# Patient Record
Sex: Male | Born: 1970 | Race: White | Hispanic: No | Marital: Married | Smoking: Former smoker
Health system: Southern US, Community
[De-identification: ages and names within clinical notes are randomized; demographics above are authoritative.]

## PROBLEM LIST (undated history)

## (undated) DIAGNOSIS — F39 Unspecified mood [affective] disorder: Secondary | ICD-10-CM

## (undated) DIAGNOSIS — F419 Anxiety disorder, unspecified: Secondary | ICD-10-CM

## (undated) DIAGNOSIS — Z860100 Personal history of colon polyps, unspecified: Secondary | ICD-10-CM

## (undated) DIAGNOSIS — F319 Bipolar disorder, unspecified: Secondary | ICD-10-CM

## (undated) HISTORY — PX: ESOPHAGOGASTRODUODENOSCOPY: SHX1529

## (undated) HISTORY — PX: COLONOSCOPY: SHX174

---

## 2004-06-01 ENCOUNTER — Ambulatory Visit: Payer: Self-pay | Admitting: Gastroenterology

## 2005-10-17 ENCOUNTER — Emergency Department: Payer: Self-pay | Admitting: Emergency Medicine

## 2009-11-11 ENCOUNTER — Emergency Department: Payer: Self-pay | Admitting: Internal Medicine

## 2015-01-17 ENCOUNTER — Emergency Department
Admission: EM | Admit: 2015-01-17 | Discharge: 2015-01-17 | Disposition: A | Discharge status: Home / Self Care | Payer: Worker's Compensation | Attending: Emergency Medicine | Admitting: Emergency Medicine

## 2015-01-17 ENCOUNTER — Emergency Department: Payer: Worker's Compensation

## 2015-01-17 ENCOUNTER — Encounter: Payer: Self-pay | Admitting: Medical Oncology

## 2015-01-17 DIAGNOSIS — S3992XA Unspecified injury of lower back, initial encounter: Secondary | ICD-10-CM | POA: Diagnosis present

## 2015-01-17 DIAGNOSIS — Y9289 Other specified places as the place of occurrence of the external cause: Secondary | ICD-10-CM | POA: Insufficient documentation

## 2015-01-17 DIAGNOSIS — X58XXXA Exposure to other specified factors, initial encounter: Secondary | ICD-10-CM | POA: Insufficient documentation

## 2015-01-17 DIAGNOSIS — Y9389 Activity, other specified: Secondary | ICD-10-CM | POA: Diagnosis not present

## 2015-01-17 DIAGNOSIS — S39012A Strain of muscle, fascia and tendon of lower back, initial encounter: Secondary | ICD-10-CM | POA: Diagnosis not present

## 2015-01-17 DIAGNOSIS — M545 Low back pain, unspecified: Secondary | ICD-10-CM

## 2015-01-17 DIAGNOSIS — Y990 Civilian activity done for income or pay: Secondary | ICD-10-CM | POA: Insufficient documentation

## 2015-01-17 DIAGNOSIS — F17200 Nicotine dependence, unspecified, uncomplicated: Secondary | ICD-10-CM | POA: Insufficient documentation

## 2015-01-17 HISTORY — DX: Anxiety disorder, unspecified: F41.9

## 2015-01-17 HISTORY — DX: Bipolar disorder, unspecified: F31.9

## 2015-01-17 MED ORDER — KETOROLAC TROMETHAMINE 60 MG/2ML IM SOLN
60.0000 mg | Freq: Once | INTRAMUSCULAR | Status: AC
  Administered 2015-01-17: 60 mg via INTRAMUSCULAR
  Filled 2015-01-17: qty 2

## 2015-01-17 MED ORDER — NAPROXEN 500 MG PO TABS: 500.0000 mg | ORAL_TABLET | Freq: Two times a day (BID) | ORAL | Status: DC

## 2015-01-17 MED ORDER — DIAZEPAM 2 MG PO TABS
2.0000 mg | ORAL_TABLET | Freq: Once | ORAL | Status: AC
  Administered 2015-01-17: 2 mg via ORAL
  Filled 2015-01-17: qty 1

## 2015-01-17 MED ORDER — HYDROCODONE-ACETAMINOPHEN 5-325 MG PO TABS: 1.0000 | ORAL_TABLET | ORAL | Status: DC | PRN

## 2015-01-17 MED ORDER — DIAZEPAM 2 MG PO TABS
2.0000 mg | ORAL_TABLET | Freq: Three times a day (TID) | ORAL | Status: DC | PRN
Start: 2015-01-17 — End: 2023-09-17

## 2015-01-17 NOTE — ED Provider Notes (Signed)
[facility] Emergency Department Provider Note  ____________________________________________  Time seen: Approximately 11:02 AM  I have reviewed the triage vital signs and the nursing notes.   HISTORY  Chief Complaint Back Pain  HPI Brian Kelly is a 44 y.o. male is brought in today by his boss with low back pain. Patient states that he bent over and felt a "twinge" to his lower back. This is left range of motion difficult for him as well as painful. Patient states that he had some back pain last year that was similar and was resolved with medication. He denies any paresthesias in his legs, no bowel or bladder incontinence. He finds it difficult to ambulate secondary to his pain. Currently his pain is 10 over 10.Currently this being filed for workmen's comp.   Past Medical History  Diagnosis Date  . Bipolar 1 disorder ([place])   . Anxiety     There are no active problems to display for this patient.   History reviewed. No pertinent past surgical history.  Current Outpatient Rx  Name  Route  Sig  Dispense  Refill  . diazepam (VALIUM) 2 MG tablet   Oral   Take 1 tablet (2 mg total) by mouth every 8 (eight) hours as needed for muscle spasms.   9 tablet   0   . HYDROcodone-acetaminophen (NORCO/VICODIN) 5-325 MG tablet   Oral   Take 1 tablet by mouth every 4 (four) hours as needed for moderate pain.   20 tablet   0   . naproxen (NAPROSYN) 500 MG tablet   Oral   Take 1 tablet (500 mg total) by mouth 2 (two) times daily with a meal.   30 tablet   0     Allergies Dilaudid  No family history on file.  Social History Social History  Substance Use Topics  . Smoking status: Current Every Day Smoker  . Smokeless tobacco: None  . Alcohol Use: No    Review of Systems Constitutional: No fever/chills Cardiovascular: Denies chest pain. Respiratory: Denies shortness of breath. Gastrointestinal: No abdominal pain.  No nausea, no vomiting.    Genitourinary: Negative for dysuria. Musculoskeletal: Positive for back pain. Skin: Negative for rash. Neurological: Negative for headaches, focal weakness or numbness.  10-point ROS otherwise negative.  ____________________________________________   PHYSICAL EXAM:  VITAL SIGNS: ED Triage Vitals  Enc Vitals Group     BP 01/17/15 1025 145/93 mmHg     Pulse Rate 01/17/15 1025 73     Resp 01/17/15 1025 18     Temp 01/17/15 1025 98.2 F (36.8 C)     Temp Source 01/17/15 1025 Oral     SpO2 01/17/15 1025 96 %     Weight 01/17/15 1025 180 lb (81.647 kg)     Height 01/17/15 1025 5\' 9"  (1.753 m)     Head Cir --      Peak Flow --      Pain Score 01/17/15 1026 10     Pain Loc --      Pain Edu? --      Excl. in [place]? --     Constitutional: Alert and oriented. Well appearing and in no acute distress. Eyes: Conjunctivae are normal. PERRL. EOMI. Head: Atraumatic. Nose: No congestion/rhinnorhea. Neck: No stridor.   Cardiovascular: Normal rate, regular rhythm. Grossly normal heart sounds.  Good peripheral circulation. Respiratory: Normal respiratory effort.  No retractions. Lungs CTAB. Gastrointestinal: Soft and nontender. No distention. Musculoskeletal: Moderate tenderness on palpation of the sacral  area. Minimal tenderness on palpation of paravertebral muscles in that area. Range of motion is restricted secondary to pain. Straight leg raises are 90 with pain on the right side. Gait was guarded. Neurologic:  Normal speech and language. No gross focal neurologic deficits are appreciated.  Reflexes are 2+ bilaterally. Skin:  Skin is warm, dry and intact. No rash noted. Psychiatric: Mood and affect are normal. Speech and behavior are normal.  ____________________________________________   LABS (all labs ordered are listed, but only abnormal results are displayed)  Labs Reviewed - No data to display   RADIOLOGY  Lumbar spine x-ray per radiologist shows no acute findings. There is  mild spondylotic thick spurring without significant dyspnea or wheezing. ____________________________________________   PROCEDURES  Procedure(s) performed: None  Critical Care performed: No  ____________________________________________   INITIAL IMPRESSION / ASSESSMENT AND PLAN / ED COURSE  Pertinent labs & imaging results that were available during my care of the patient were reviewed by me and considered in my medical decision making (see chart for details).  Patient was given Toradol 60 mg IM while in the emergency room along with Valium 2 mg by mouth. Patient was made aware of his x-ray findings and discharged on a prescription with naproxen 500 mg twice a day with food, Norco as needed for pain every 4 hours, and Valium 2 mg 1 every 8 hours when necessary muscle spasms for 3 days. Patient was given a note to take back to work with him for 2 days that he is not to work on these medications as it could cause drowsiness and also no lifting over 25 pounds. He is to follow-up with Dr. Roland Rack if any continued problems. ____________________________________________   FINAL CLINICAL IMPRESSION(S) / ED DIAGNOSES  Final diagnoses:  Acute low back pain  Low back strain, initial encounter      DALESSANDRO SCATENA, PA-C 01/17/15 1432  Earleen Newport, MD 01/17/15 [phone]

## 2015-01-17 NOTE — ED Notes (Signed)
Pt employer called to provide transportation back to work.

## 2015-01-17 NOTE — ED Notes (Addendum)
Pt reports hurting his back while working; states he bent over and when he stood up, back began to hurt.  Pt w/ difficulty standing due to pain.

## 2015-01-17 NOTE — ED Notes (Signed)
Pt was at work this am when he bent over and injured lower back. WC.

## 2015-01-17 NOTE — Discharge Instructions (Signed)
Back Pain, Adult Back pain is very common. The pain often gets better over time. The cause of back pain is usually not dangerous. Most people can learn to manage their back pain on their own.  HOME CARE  Watch your back pain for any changes. The following actions may help to lessen any pain you are feeling:  Stay active. Start with short walks on flat ground if you can. Try to walk farther each day.  Exercise regularly as told by your doctor. Exercise helps your back heal faster. It also helps avoid future injury by keeping your muscles strong and flexible.  Do not sit, drive, or stand in one place for more than 30 minutes.  Do not stay in bed. Resting more than 1-2 days can slow down your recovery.  Be careful when you bend or lift an object. Use good form when lifting:  Bend at your knees.  Keep the object close to your body.  Do not twist.  Sleep on a firm mattress. Lie on your side, and bend your knees. If you lie on your back, put a pillow under your knees.  Take medicines only as told by your doctor.  Put ice on the injured area.  Put ice in a plastic bag.  Place a towel between your skin and the bag.  Leave the ice on for 20 minutes, 2-3 times a day for the first 2-3 days. After that, you can switch between ice and heat packs.  Avoid feeling anxious or stressed. Find good ways to deal with stress, such as exercise.  Maintain a healthy weight. Extra weight puts stress on your back. GET HELP IF:   You have pain that does not go away with rest or medicine.  You have worsening pain that goes down into your legs or buttocks.  You have pain that does not get better in one week.  You have pain at night.  You lose weight.  You have a fever or chills. GET HELP RIGHT AWAY IF:   You cannot control when you poop (bowel movement) or pee (urinate).  Your arms or legs feel weak.  Your arms or legs lose feeling (numbness).  You feel sick to your stomach (nauseous) or  throw up (vomit).  You have belly (abdominal) pain.  You feel like you may pass out (faint).   This information is not intended to replace advice given to you by your health care provider. Make sure you discuss any questions you have with your health care provider.   Document Released: 07/17/2007 Document Revised: 02/18/2014 Document Reviewed: 06/01/2013 Elsevier Interactive Patient Education 2016 [place] or heat to low back as needed for comfort. Begin taking naproxen 500 mg twice a day with food, Norco as needed for severe pain, and diazepam one every 8 hours as needed for muscle spasms. You may not drive while taking this medication as it may increase your drowsiness. Follow-up with Dr. Roland Rack if any continued problems with your back. Return to the emergency room if any severe worsening of her symptoms or loss of bowel or bladder control.

## 2016-07-19 ENCOUNTER — Encounter: Payer: Self-pay | Admitting: *Deleted

## 2016-07-22 ENCOUNTER — Ambulatory Visit
Admission: RE | Admit: 2016-07-22 | Discharge: 2016-07-22 | Disposition: A | Discharge status: Home / Self Care | Payer: 59 | Source: Ambulatory Visit | Attending: Gastroenterology | Admitting: Gastroenterology

## 2016-07-22 ENCOUNTER — Encounter
Admission: RE | Disposition: A | Discharge status: Home / Self Care | Payer: Self-pay | Source: Ambulatory Visit | Attending: Gastroenterology

## 2016-07-22 ENCOUNTER — Ambulatory Visit: Payer: 59 | Admitting: Anesthesiology

## 2016-07-22 ENCOUNTER — Encounter: Payer: Self-pay | Admitting: *Deleted

## 2016-07-22 DIAGNOSIS — F419 Anxiety disorder, unspecified: Secondary | ICD-10-CM | POA: Diagnosis not present

## 2016-07-22 DIAGNOSIS — F319 Bipolar disorder, unspecified: Secondary | ICD-10-CM | POA: Insufficient documentation

## 2016-07-22 DIAGNOSIS — I10 Essential (primary) hypertension: Secondary | ICD-10-CM | POA: Diagnosis not present

## 2016-07-22 DIAGNOSIS — K529 Noninfective gastroenteritis and colitis, unspecified: Secondary | ICD-10-CM | POA: Diagnosis present

## 2016-07-22 DIAGNOSIS — D124 Benign neoplasm of descending colon: Secondary | ICD-10-CM | POA: Diagnosis not present

## 2016-07-22 DIAGNOSIS — Z885 Allergy status to narcotic agent status: Secondary | ICD-10-CM | POA: Insufficient documentation

## 2016-07-22 DIAGNOSIS — Z86010 Personal history of colonic polyps: Secondary | ICD-10-CM | POA: Insufficient documentation

## 2016-07-22 DIAGNOSIS — D649 Anemia, unspecified: Secondary | ICD-10-CM | POA: Insufficient documentation

## 2016-07-22 DIAGNOSIS — Z87891 Personal history of nicotine dependence: Secondary | ICD-10-CM | POA: Insufficient documentation

## 2016-07-22 DIAGNOSIS — D125 Benign neoplasm of sigmoid colon: Secondary | ICD-10-CM | POA: Insufficient documentation

## 2016-07-22 DIAGNOSIS — D128 Benign neoplasm of rectum: Secondary | ICD-10-CM | POA: Insufficient documentation

## 2016-07-22 DIAGNOSIS — Z79899 Other long term (current) drug therapy: Secondary | ICD-10-CM | POA: Insufficient documentation

## 2016-07-22 HISTORY — DX: Personal history of colonic polyps: Z86.010

## 2016-07-22 HISTORY — DX: Unspecified mood (affective) disorder: F39

## 2016-07-22 HISTORY — DX: Personal history of colon polyps, unspecified: Z86.0100

## 2016-07-22 HISTORY — PX: COLONOSCOPY WITH PROPOFOL: SHX5780

## 2016-07-22 SURGERY — COLONOSCOPY WITH PROPOFOL
Anesthesia: General

## 2016-07-22 MED ORDER — PROPOFOL 10 MG/ML IV BOLUS
INTRAVENOUS | Status: DC | PRN
  Administered 2016-07-22: 30 mg via INTRAVENOUS
  Administered 2016-07-22: 10 mg via INTRAVENOUS
  Administered 2016-07-22: 30 mg via INTRAVENOUS
  Administered 2016-07-22: 60 mg via INTRAVENOUS

## 2016-07-22 MED ORDER — SODIUM CHLORIDE 0.9 % IV SOLN: INTRAVENOUS | Status: DC

## 2016-07-22 MED ORDER — MIDAZOLAM HCL 2 MG/2ML IJ SOLN
INTRAMUSCULAR | Status: AC
  Filled 2016-07-22: qty 2

## 2016-07-22 MED ORDER — PROPOFOL 500 MG/50ML IV EMUL
INTRAVENOUS | Status: DC | PRN
  Administered 2016-07-22: 180 ug/kg/min via INTRAVENOUS

## 2016-07-22 MED ORDER — PROPOFOL 10 MG/ML IV BOLUS
INTRAVENOUS | Status: AC
  Filled 2016-07-22: qty 20

## 2016-07-22 MED ORDER — FENTANYL CITRATE (PF) 100 MCG/2ML IJ SOLN
INTRAMUSCULAR | Status: AC
  Filled 2016-07-22: qty 2

## 2016-07-22 MED ORDER — MIDAZOLAM HCL 2 MG/2ML IJ SOLN
INTRAMUSCULAR | Status: DC | PRN
  Administered 2016-07-22: 2 mg via INTRAVENOUS

## 2016-07-22 MED ORDER — PROPOFOL 500 MG/50ML IV EMUL
INTRAVENOUS | Status: AC
  Filled 2016-07-22: qty 50

## 2016-07-22 MED ORDER — LIDOCAINE HCL 2 % IJ SOLN
INTRAMUSCULAR | Status: AC
  Filled 2016-07-22: qty 10

## 2016-07-22 MED ORDER — FENTANYL CITRATE (PF) 100 MCG/2ML IJ SOLN
INTRAMUSCULAR | Status: DC | PRN
  Administered 2016-07-22: 50 ug via INTRAVENOUS
  Administered 2016-07-22: 25 ug via INTRAVENOUS

## 2016-07-22 MED ORDER — SODIUM CHLORIDE 0.9 % IV SOLN
INTRAVENOUS | Status: DC
  Administered 2016-07-22 (×2): via INTRAVENOUS

## 2016-07-22 NOTE — Anesthesia Procedure Notes (Signed)
Date/Time: 07/22/2016 2:05 PM Performed by: Allean Found Pre-anesthesia Checklist: Patient identified, Emergency Drugs available, Suction available, Patient being monitored and Timeout performed Oxygen Delivery Method: Nasal cannula Placement Confirmation: positive ETCO2

## 2016-07-22 NOTE — Anesthesia Post-op Follow-up Note (Cosign Needed)
Anesthesia QCDR form completed.        

## 2016-07-22 NOTE — Anesthesia Preprocedure Evaluation (Addendum)
Anesthesia Evaluation  Patient identified by MRN, date of birth, ID band Patient awake    Reviewed: Allergy & Precautions, NPO status , Patient's Chart, lab work & pertinent test results  History of Anesthesia Complications Negative for: history of anesthetic complications  Airway Mallampati: II  TM Distance: >3 FB Neck ROM: Full    Dental  (+) Poor Dentition   Pulmonary neg sleep apnea, neg COPD, former smoker,    breath sounds clear to auscultation- rhonchi (-) wheezing      Cardiovascular Exercise Tolerance: Good hypertension, (-) CAD and (-) Past MI  Rhythm:Regular Rate:Normal - Systolic murmurs and - Diastolic murmurs    Neuro/Psych PSYCHIATRIC DISORDERS Anxiety Bipolar Disorder negative neurological ROS     GI/Hepatic negative GI ROS, Neg liver ROS,   Endo/Other  negative endocrine ROSneg diabetes  Renal/GU negative Renal ROS     Musculoskeletal negative musculoskeletal ROS (+)   Abdominal (+) - obese,   Peds  Hematology negative hematology ROS (+)   Anesthesia Other Findings Past Medical History: No date: Anxiety No date: Bipolar 1 disorder ([facility]) No date: History of colon polyps No date: Mood disorder ([place])   Reproductive/Obstetrics                             Anesthesia Physical Anesthesia Plan  ASA: II  Anesthesia Plan: General   Post-op Pain Management:    Induction: Intravenous  PONV Risk Score and Plan: 1 and Propofol  Airway Management Planned: Natural Airway  Additional Equipment:   Intra-op Plan:   Post-operative Plan:   Informed Consent: I have reviewed the patients History and Physical, chart, labs and discussed the procedure including the risks, benefits and alternatives for the proposed anesthesia with the patient or authorized representative who has indicated his/her understanding and acceptance.   Dental advisory given  Plan Discussed with: CRNA  and Anesthesiologist  Anesthesia Plan Comments:        Anesthesia Quick Evaluation

## 2016-07-22 NOTE — Op Note (Signed)
[facility] Patient Name: Brian Kelly Procedure Date: 07/22/2016 1:59 PM MRN: [id number] Account #: [id number] Date of Birth: [birth year] Admit Type: Outpatient Age: 46 Room: [facility] ENDO ROOM 1 Gender: Male Note Status: Finalized Procedure:            Colonoscopy Indications:          Chronic diarrhea, Personal history of colonic polyps Providers:            Lollie Sails, MD Referring MD:         Tracie Harrier, MD (Referring MD) Medicines:            Monitored Anesthesia Care Complications:        No immediate complications. Procedure:            Pre-Anesthesia Assessment:                       - ASA Grade Assessment: II - A patient with mild                        systemic disease.                       After obtaining informed consent, the colonoscope was                        passed under direct vision. Throughout the procedure,                        the patient's blood pressure, pulse, and oxygen                        saturations were monitored continuously. The                        Colonoscope was introduced through the anus and                        advanced to the the cecum, identified by appendiceal                        orifice and ileocecal valve. The colonoscopy was                        unusually difficult due to poor bowel prep and a                        tortuous colon. Successful completion of the procedure                        was aided by lavage. The quality of the bowel                        preparation was fair. Findings:      Two sessile polyps were found in the rectum. The polyps were 1 to 2 mm       in size. These polyps were removed with a cold biopsy forceps. Resection       and retrieval were complete.      A 5 mm polyp was found in the distal sigmoid colon. The polyp was       pedunculated. The  polyp was removed with a cold snare. Resection and       retrieval were complete.      A 4 mm polyp was  found in the proximal sigmoid colon. The polyp was       flat. The polyp was removed with a cold snare. Resection and retrieval       were complete.      Two sessile polyps were found in the proximal descending colon. The       polyps were 3 to 5 mm in size. These polyps were removed with a cold       snare. Resection and retrieval were complete.      Biopsies for histology were taken with a cold forceps from the right       colon and left colon for evaluation of microscopic colitis.      Four sessile polyps were found in the distal descending colon. The       polyps were 3 to 6 mm in size. These polyps were removed with a cold       snare. Resection and retrieval were complete.      Two sessile polyps were found in the distal sigmoid colon. The polyps       were 1 to 2 mm in size. These polyps were removed with a cold biopsy       forceps. Resection and retrieval were complete. Impression:           - Preparation of the colon was fair.                       - Two 1 to 2 mm polyps in the rectum, removed with a                        cold biopsy forceps. Resected and retrieved.                       - One 5 mm polyp in the distal sigmoid colon, removed                        with a cold snare. Resected and retrieved.                       - One 4 mm polyp in the proximal sigmoid colon, removed                        with a cold snare. Resected and retrieved.                       - Two 3 to 5 mm polyps in the proximal descending                        colon, removed with a cold snare. Resected and                        retrieved.                       - Four 3 to 6 mm polyps in the distal descending colon,                        removed with a  cold snare. Resected and retrieved.                       - Two 1 to 2 mm polyps in the distal sigmoid colon,                        removed with a cold biopsy forceps. Resected and                        retrieved.                       - Biopsies  were taken with a cold forceps from the                        right colon and left colon for evaluation of                        microscopic colitis. Recommendation:       - Await pathology results.                       - Full liquid diet for 1 day, then advance as tolerated                        to low residue diet for 2 days.                       - Return to GI clinic in 3 weeks. Procedure Code(s):    --- Professional ---                       [phone], Colonoscopy, flexible; with removal of tumor(s),                        polyp(s), or other lesion(s) by snare technique                       45380, 34, Colonoscopy, flexible; with biopsy, single                        or multiple Diagnosis Code(s):    --- Professional ---                       K62.1, Rectal polyp                       D12.4, Benign neoplasm of descending colon                       D12.5, Benign neoplasm of sigmoid colon                       K52.9, Noninfective gastroenteritis and colitis,                        unspecified                       Z86.010, Personal history of colonic polyps CPT copyright 2016 American Medical [employer]. All rights reserved. The codes documented in this report are preliminary and upon coder review may  be  revised to meet current compliance requirements. Lollie Sails, MD 07/22/2016 3:22:00 PM This report has been signed electronically. Number of Addenda: 0 Note Initiated On: 07/22/2016 1:59 PM Scope Withdrawal Time: 0 hours 29 minutes 45 seconds  Total Procedure Duration: 0 hours 50 minutes 51 seconds       [facility]

## 2016-07-22 NOTE — Transfer of Care (Signed)
Immediate Anesthesia Transfer of Care Note  Patient: Brian Kelly  Procedure(s) Performed: Procedure(s): COLONOSCOPY WITH PROPOFOL (N/A)  Patient Location: PACU  Anesthesia Type:General  Level of Consciousness: awake  Airway & Oxygen Therapy: Patient Spontanous Breathing and Patient connected to nasal cannula oxygen  Post-op Assessment: Report given to RN and Post -op Vital signs reviewed and stable  Post vital signs: Reviewed and stable  Last Vitals:  Vitals:   07/22/16 1308 07/22/16 1521  BP: (!) 131/92 (!) 125/91  Pulse: 79 81  Resp:  16  Temp: (!) 35.8 C 36.1 C    Last Pain:  Vitals:   07/22/16 1521  TempSrc: Tympanic         Complications: No apparent anesthesia complications

## 2016-07-22 NOTE — H&P (Signed)
Outpatient short stay form Pre-procedure 07/22/2016 2:10 PM Lollie Sails MD  Primary Physician: Dr Tracie Harrier  Reason for visit:  Colonoscopy  History of present illness:  Patient is a 46 year old male with a history of chronic diarrhea for at least past 10 years. This is also at times associated with rectal bleeding. He does have some mild anemia that is normocytic. He does drink [address] use daily. I reviewed her EGD and colonoscopy that he had an 2006 which were uninformative as to this issue. He did also have a colonoscopy in 2005 with removal of a tubular villous adenoma that was 20 mm in size. There is also a second adenoma that was much smaller.    Current Facility-Administered Medications:  .  0.9 %  sodium chloride infusion, , Intravenous, Continuous, Lollie Sails, MD, Last Rate: 20 mL/hr at 07/22/16 1323 .  0.9 %  sodium chloride infusion, , Intravenous, Continuous, Lollie Sails, MD  Prescriptions Prior to Admission  Medication Sig Dispense Refill Last Dose  . carbamazepine (TEGRETOL) 200 MG tablet Take 200 mg by mouth as directed.   Past Week at Unknown time  . cyclobenzaprine (FLEXERIL) 5 MG tablet Take 5 mg by mouth 3 (three) times daily as needed for muscle spasms.   Past Week at Unknown time  . metoprolol succinate (TOPROL-XL) 25 MG 24 hr tablet Take 25 mg by mouth daily.   Past Week at Unknown time  . polyethylene glycol powder (GLYCOLAX/MIRALAX) powder Take 1 Container by mouth once.   07/21/2016 at Unknown time  . QUEtiapine (SEROQUEL XR) 300 MG 24 hr tablet Take 300 mg by mouth at bedtime.   07/21/2016 at Unknown time  . vitamin B-12 (CYANOCOBALAMIN) 1000 MCG tablet Take 1,000 mcg by mouth daily.   Past Week at Unknown time  . diazepam (VALIUM) 2 MG tablet Take 1 tablet (2 mg total) by mouth every 8 (eight) hours as needed for muscle spasms. (Patient not taking: Reported on 07/22/2016) 9 tablet 0 Not Taking at Unknown time  . etodolac (LODINE)  500 MG tablet Take 500 mg by mouth 2 (two) times daily.   Not Taking at Unknown time  . HYDROcodone-acetaminophen (NORCO/VICODIN) 5-325 MG tablet Take 1 tablet by mouth every 4 (four) hours as needed for moderate pain. (Patient not taking: Reported on 07/22/2016) 20 tablet 0 Completed Course at Unknown time  . lithium carbonate 300 MG capsule Take 300 mg by mouth 2 (two) times daily with a meal.   Not Taking at Unknown time  . naproxen (NAPROSYN) 500 MG tablet Take 1 tablet (500 mg total) by mouth 2 (two) times daily with a meal. (Patient not taking: Reported on 07/22/2016) 30 tablet 0 Not Taking at Unknown time     Allergies  Allergen Reactions  . Dilaudid [Hydromorphone Hcl]      Past Medical History:  Diagnosis Date  . Anxiety   . Bipolar 1 disorder ([place])   . History of colon polyps   . Mood disorder ([place])     Review of systems:      Physical Exam    Heart and lungs: Regular rate and rhythm without rub or gallop, lungs are bilaterally clear.    HEENT: Normocephalic atraumatic eyes are anicteric    Other:     Pertinant exam for procedure: Soft nontender nondistended bowel sounds positive normoactive.    Planned proceedures: Colonoscopy and indicated procedures. I have discussed the risks benefits and complications of procedures to include  not limited to bleeding, infection, perforation and the risk of sedation and the patient wishes to proceed.    Lollie Sails, MD Gastroenterology 07/22/2016  2:10 PM

## 2016-07-23 ENCOUNTER — Encounter: Payer: Self-pay | Admitting: Gastroenterology

## 2016-07-23 NOTE — Anesthesia Postprocedure Evaluation (Signed)
Anesthesia Post Note  Patient: Brian Kelly  Procedure(s) Performed: Procedure(s) (LRB): COLONOSCOPY WITH PROPOFOL (N/A)  Patient location during evaluation: PACU Anesthesia Type: General Level of consciousness: awake and alert and oriented Pain management: pain level controlled Vital Signs Assessment: post-procedure vital signs reviewed and stable Respiratory status: spontaneous breathing Cardiovascular status: blood pressure returned to baseline Anesthetic complications: no     Last Vitals:  Vitals:   07/22/16 1541 07/22/16 1551  BP: (!) 131/92 (!) 143/99  Pulse: 65 62  Resp: 19 12  Temp:      Last Pain:  Vitals:   07/23/16 0744  TempSrc:   PainSc: 0-No pain                 Noeh Sparacino

## 2016-07-24 LAB — SURGICAL PATHOLOGY

## 2021-07-31 ENCOUNTER — Emergency Department
Admission: EM | Admit: 2021-07-31 | Discharge: 2021-07-31 | Disposition: A | Discharge status: Home / Self Care | Payer: No Typology Code available for payment source | Attending: Emergency Medicine | Admitting: Emergency Medicine

## 2021-07-31 ENCOUNTER — Encounter: Payer: Self-pay | Admitting: Emergency Medicine

## 2021-07-31 ENCOUNTER — Emergency Department: Payer: No Typology Code available for payment source

## 2021-07-31 ENCOUNTER — Other Ambulatory Visit: Payer: Self-pay

## 2021-07-31 DIAGNOSIS — R202 Paresthesia of skin: Secondary | ICD-10-CM | POA: Insufficient documentation

## 2021-07-31 DIAGNOSIS — F419 Anxiety disorder, unspecified: Secondary | ICD-10-CM | POA: Insufficient documentation

## 2021-07-31 LAB — CBC
HCT: 41.7 % (ref 39.0–52.0)
Hemoglobin: 13.6 g/dL (ref 13.0–17.0)
MCH: 32.5 pg (ref 26.0–34.0)
MCHC: 32.6 g/dL (ref 30.0–36.0)
MCV: 99.5 fL (ref 80.0–100.0)
Platelets: 174 10*3/uL (ref 150–400)
RBC: 4.19 MIL/uL — ABNORMAL LOW (ref 4.22–5.81)
RDW: 12.9 % (ref 11.5–15.5)
WBC: 9.9 10*3/uL (ref 4.0–10.5)
nRBC: 0 % (ref 0.0–0.2)

## 2021-07-31 LAB — BASIC METABOLIC PANEL
Anion gap: 5 (ref 5–15)
BUN: 11 mg/dL (ref 6–20)
CO2: 27 mmol/L (ref 22–32)
Calcium: 9.5 mg/dL (ref 8.9–10.3)
Chloride: 105 mmol/L (ref 98–111)
Creatinine, Ser: 0.82 mg/dL (ref 0.61–1.24)
GFR, Estimated: >60 mL/min (ref >60)
Glucose, Bld: 95 mg/dL (ref 70–99)
Potassium: 4.1 mmol/L (ref 3.5–5.1)
Sodium: 137 mmol/L (ref 135–145)

## 2021-07-31 LAB — CK: Total CK: 54 U/L (ref 49–397)

## 2021-07-31 LAB — HEPATIC FUNCTION PANEL
ALT: 15 U/L (ref 0–44)
AST: 16 U/L (ref 15–41)
Albumin: 3.9 g/dL (ref 3.5–5.0)
Alkaline Phosphatase: 94 U/L (ref 38–126)
Bilirubin, Direct: <0.1 mg/dL (ref 0.0–0.2)
Total Bilirubin: 0.6 mg/dL (ref 0.3–1.2)
Total Protein: 7.2 g/dL (ref 6.5–8.1)

## 2021-07-31 LAB — CBG MONITORING, ED: Glucose-Capillary: 111 mg/dL — ABNORMAL HIGH (ref 70–99)

## 2021-07-31 LAB — MAGNESIUM: Magnesium: 2.2 mg/dL (ref 1.7–2.4)

## 2021-07-31 LAB — TROPONIN I (HIGH SENSITIVITY)
Troponin I (High Sensitivity): <2 ng/L (ref <18)
Troponin I (High Sensitivity): <2 ng/L (ref <18)

## 2021-07-31 LAB — ETHANOL: Alcohol, Ethyl (B): <10 mg/dL (ref <10)

## 2021-07-31 LAB — LITHIUM LEVEL: Lithium Lvl: 0.6 mmol/L (ref 0.60–1.20)

## 2021-07-31 MED ORDER — LORAZEPAM 2 MG/ML IJ SOLN
0.5000 mg | Freq: Once | INTRAMUSCULAR | Status: AC
  Administered 2021-07-31: 0.5 mg via INTRAVENOUS
  Filled 2021-07-31: qty 1

## 2021-07-31 NOTE — Discharge Instructions (Addendum)
I suspect this could be related to your anxiety and you should call your mental health doctor to discuss your medications to decide if you should continue your Lamictal and return to the ER if you develop return of symptoms, worsening pain or any other concern

## 2021-07-31 NOTE — ED Triage Notes (Signed)
Pt via POV from home. Pt was at work today and had a sudden onset of weakness and bilateral arm and leg numbness. Denies any pain. Pt states he did start a new Bipolar medication last week. Pt is A&Ox4 and NAD.

## 2021-07-31 NOTE — ED Provider Notes (Addendum)
[facility] Provider Note    Event Date/Time   First MD Initiated Contact with Patient 07/31/21 [phone]     (approximate)   History   Weakness and Numbness   HPI  Brian Kelly is a 51 y.o. male with history of bipolar, anxiety who comes in with sudden onset of tingling.  Patient reports that he was at work today when he was moving a belt when he had sudden onset of feeling like he had a jolt of addrenal in and his entire body went tingly and weak.  He reports he is not really feel any weakness to 1 side of his body is more of just like a decreased sensation throughout his entire body.  He denies this ever happening previously.  Onset was 620/2023 at 7:30 AM he denies any chest pain or shortness of breath.  Denies any alcohol or drug use.  Physical Exam   Triage Vital Signs: ED Triage Vitals  Enc Vitals Group     BP 07/31/21 0927 (!) 126/91     Pulse Rate 07/31/21 0927 78     Resp 07/31/21 0927 16     Temp 07/31/21 0927 97.9 F (36.6 C)     Temp Source 07/31/21 0927 Oral     SpO2 07/31/21 0927 99 %     Weight 07/31/21 0921 163 lb (73.9 kg)     Height 07/31/21 0921 '5\' 9"'$  (1.753 m)     Head Circumference --      Peak Flow --      Pain Score 07/31/21 0921 0     Pain Loc --      Pain Edu? --      Excl. in [place]? --     Most recent vital signs: Vitals:   07/31/21 0927  BP: (!) 126/91  Pulse: 78  Resp: 16  Temp: 97.9 F (36.6 C)  SpO2: 99%     General: Awake, no distress.  CV:  Good peripheral perfusion.  Resp:  Normal effort.  Abd:  No distention.  Other:  Cranial nerves II through XII are intact.  Equal strength in arms and legs.  Similarly decreased sensation in his arms and legs.  NIH stroke scale of 1   ED Results / Procedures / Treatments   Labs (all labs ordered are listed, but only abnormal results are displayed) Labs Reviewed  CBG MONITORING, ED - Abnormal; Notable for the following components:      Result Value    Glucose-Capillary 111 (*)    All other components within normal limits  BASIC METABOLIC PANEL  CBC  URINALYSIS, ROUTINE W REFLEX MICROSCOPIC  MAGNESIUM  HEPATIC FUNCTION PANEL  CK  ETHANOL  URINE DRUG SCREEN, QUALITATIVE ([facility] ONLY)  LITHIUM LEVEL  TROPONIN I (HIGH SENSITIVITY)     EKG  My interpretation of EKG:  Normal sinus rate of 78 without any ST elevation or T wave inversions, normal intervals  RADIOLOGY I have reviewed the xray personally and agree with radiology read  CT head negative    PROCEDURES:  Critical Care performed: No  .1-3 Lead EKG Interpretation  Performed by: Vanessa [place], MD Authorized by: Vanessa [place], MD     Interpretation: normal     ECG rate:  70   ECG rate assessment: normal     Rhythm: sinus rhythm     Ectopy: none     Conduction: normal      MEDICATIONS ORDERED IN ED: Medications  LORazepam (ATIVAN)  injection 0.5 mg (has no administration in time range)     IMPRESSION / MDM / ASSESSMENT AND PLAN / ED COURSE  I reviewed the triage vital signs and the nursing notes.   Patient's presentation is most consistent with acute presentation with potential threat to life or bodily function.   NIH stroke scale is 1 based upon the sensation deficit but given his throughout his entire body does not seem to correlate with a stroke.  I suspect this is more likely related to anxiety, medication reaction or electrolyte abnormalities  BMP normal mag normal LFTs normal CK normal troponin negative lithium normal.  CBC reassuring  On repeat assessment patient reports resolution of symptoms.  Patient reports feeling at his baseline self after the Ativan.  No sensation deficits at this time.  Discussed that I suspect is more likely related to his anxiety and mental health.  They were able to find that he was placed on Lamictal but he is on a very low-dose of 50 mg I do not feel he is toxic from that.  Unclear if it could be a reaction to it.  No rash  or other signs of a reaction from the Lamictal.  He is going to call his mental health doctor today to further discuss whether or not he should continue the Lamictal.  He expressed understanding and felt comfortable with discharge at this time  1:13 PM repeat troponin is negative.  Patient has no recurrent symptoms and feels comfortable with discharge home  The patient is on the cardiac monitor to evaluate for evidence of arrhythmia and/or significant heart rate changes.      FINAL CLINICAL IMPRESSION(S) / ED DIAGNOSES   Final diagnoses:  Tingling  Anxiety     Rx / DC Orders   ED Discharge Orders     None        Note:  This document was prepared using Dragon voice recognition software and may include unintentional dictation errors.   Vanessa [place], MD 07/31/21 1231    Vanessa [place], MD 07/31/21 1314

## 2022-04-22 NOTE — Nursing Note (Signed)
 Dr Shirline at bedside discussing procedure and results,  pt drinking water without difficulty  Discharge instructions reviewed with pt and wife.

## 2022-11-16 IMAGING — CR DG CHEST 2V
2 series · 2 of 2 positions shown · non-contrast
Comparison: None Available.

CLINICAL DATA: Tingling

EXAM:
CHEST - 2 VIEW

[chest lat]
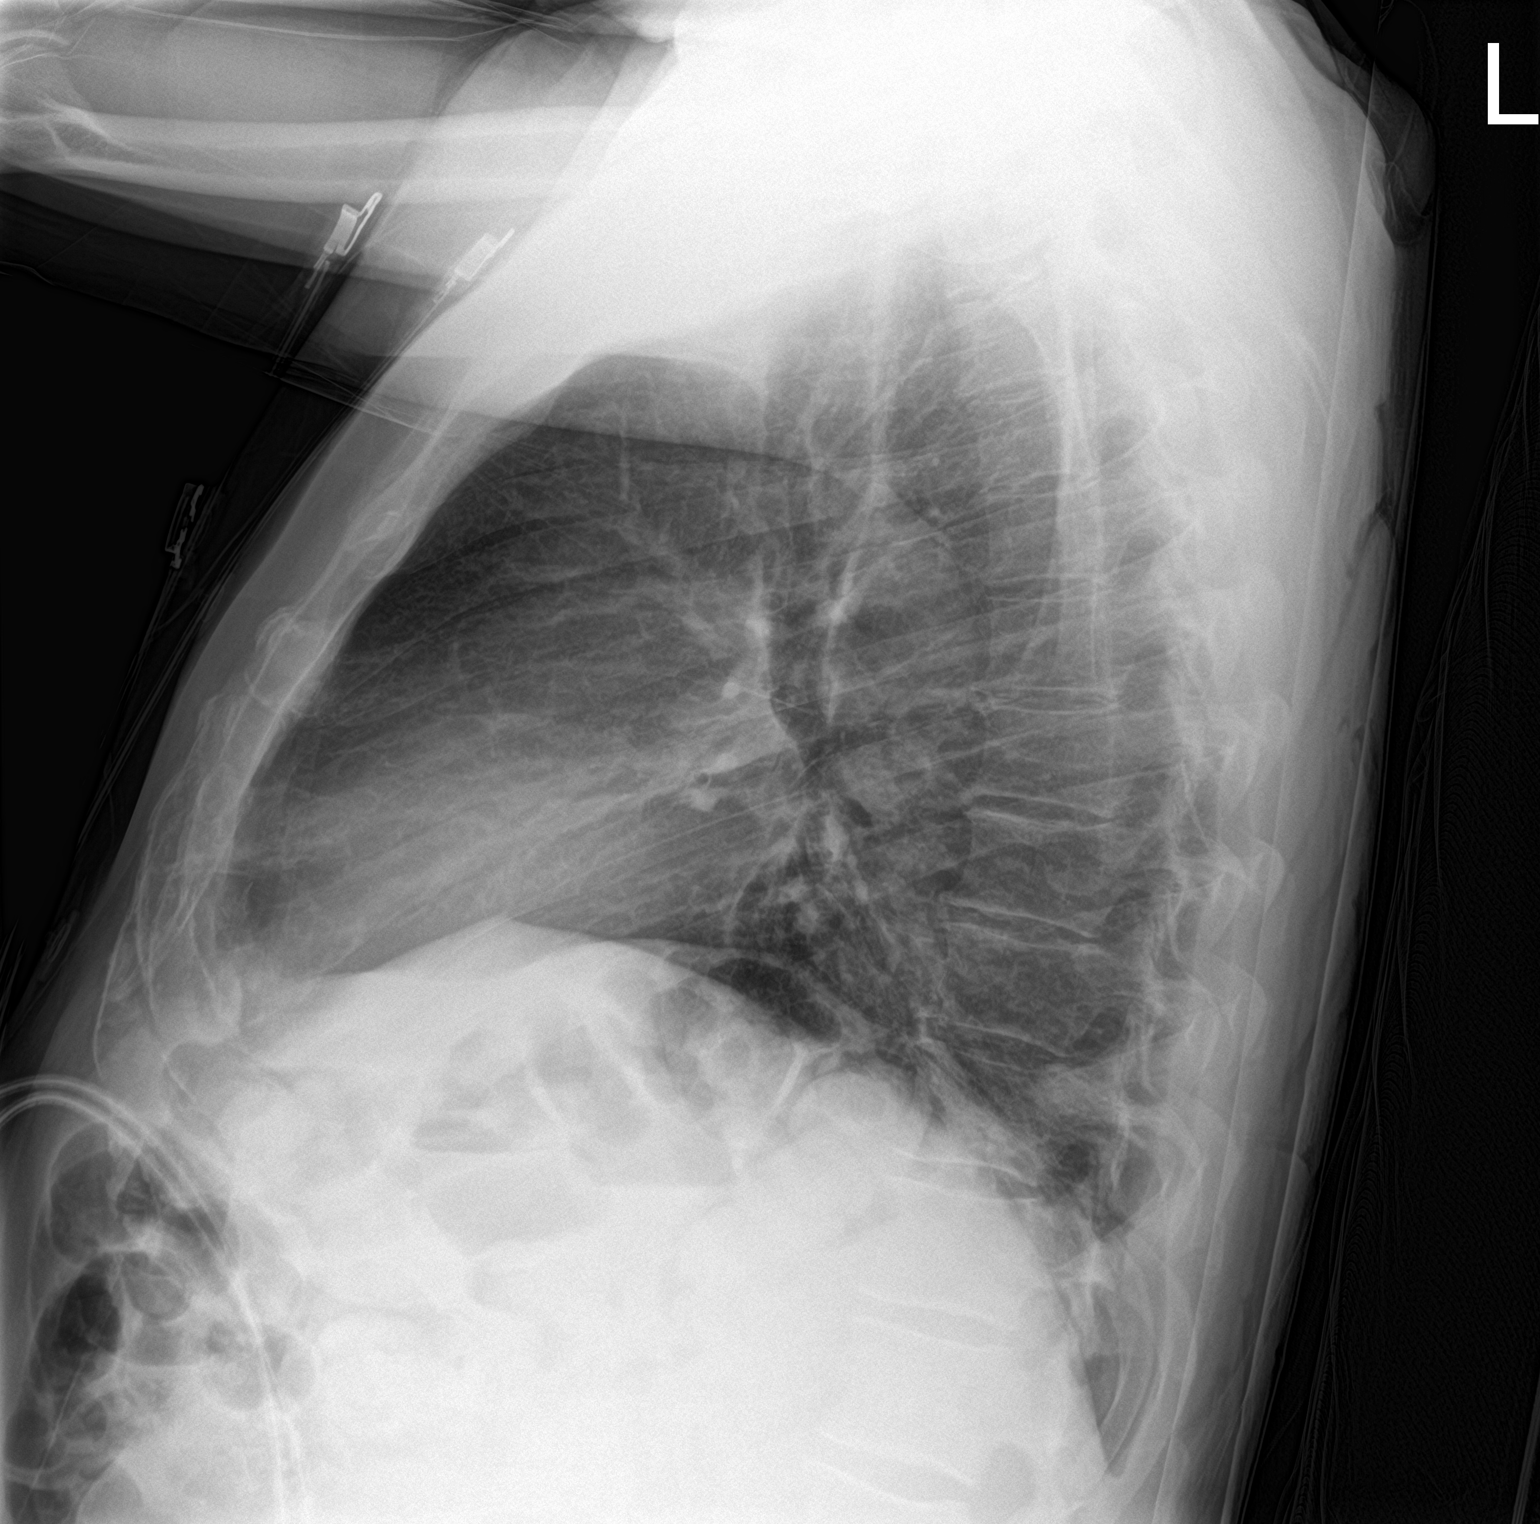

[chest ap]
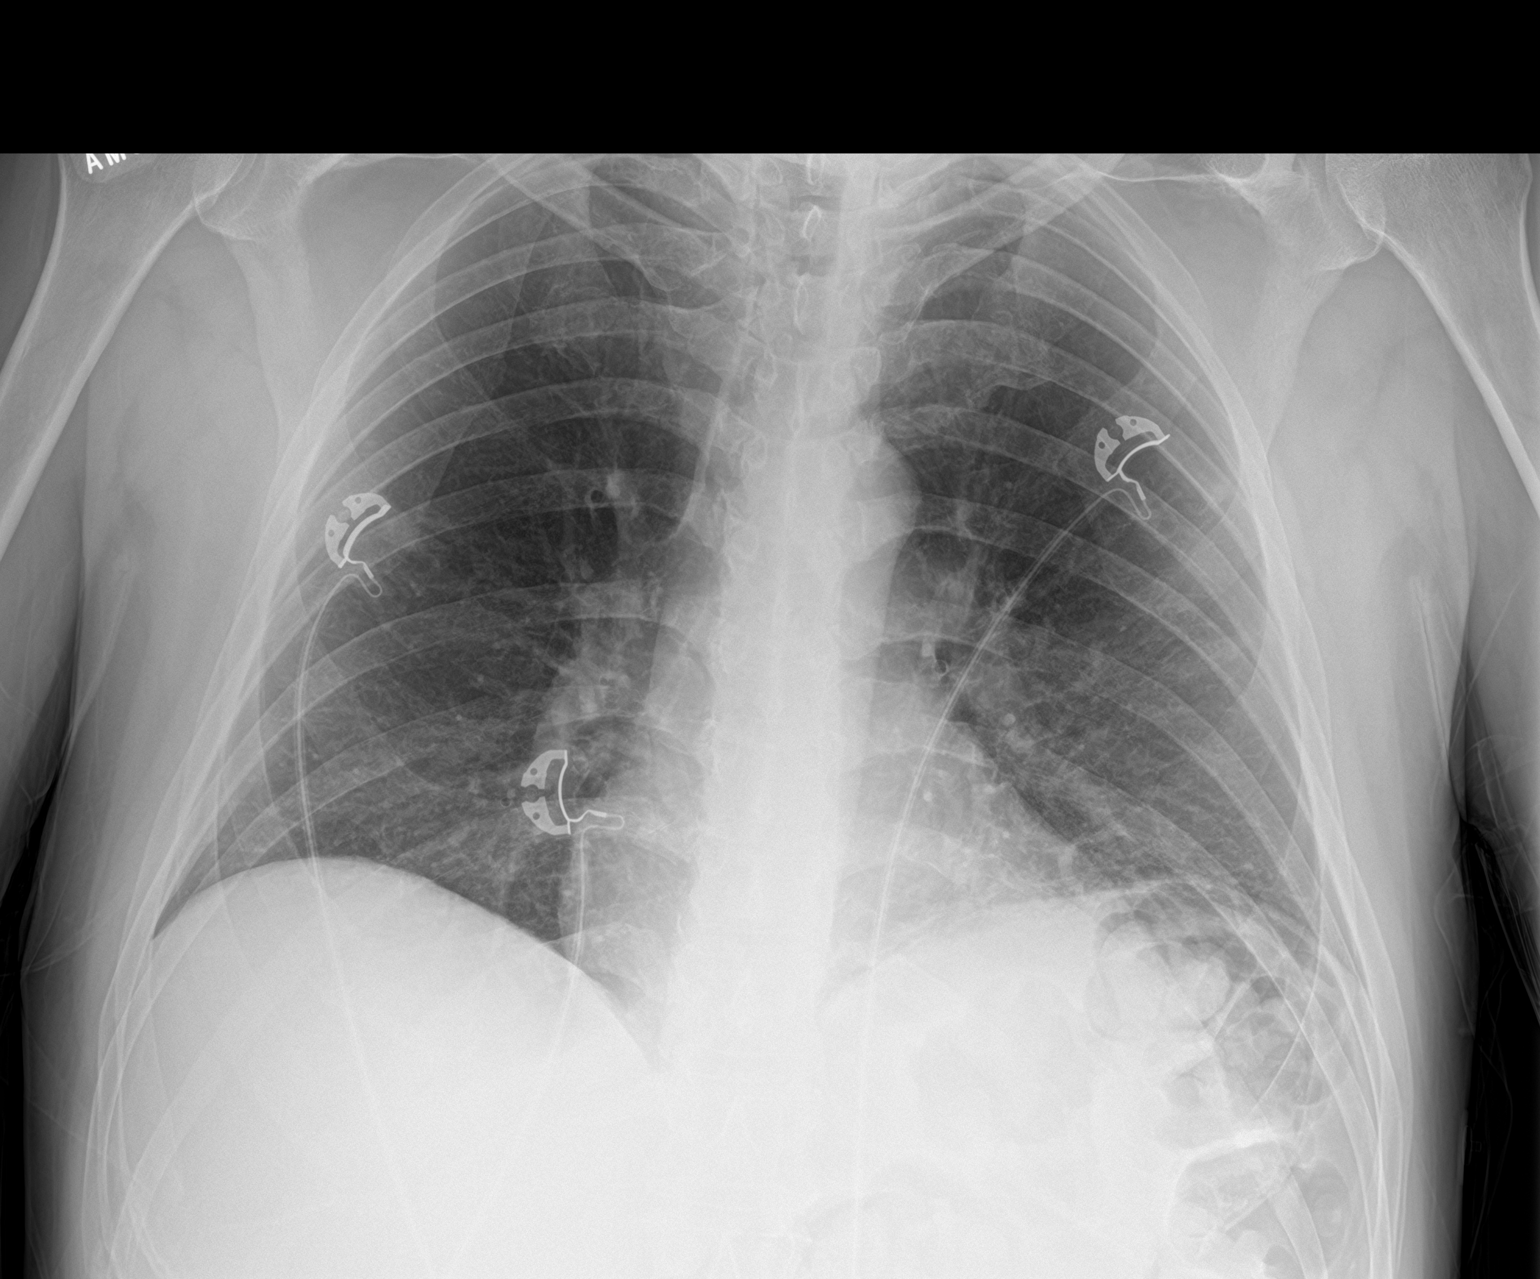

[2 of 2 positions shown; findings below may reference images not displayed]

FINDINGS: The heart size and mediastinal contours are within normal limits.
Mild left basilar opacity, likely due to atelectasis. Lungs
otherwise clear. The visualized skeletal structures are
unremarkable.
IMPRESSION: Mild left basilar opacity, likely due to atelectasis. Lungs
otherwise clear.

## 2023-09-15 NOTE — Progress Notes (Unsigned)
 Referring Provider:*** Primary Care Physician:  Honey Listen, MD Primary Gastroenterologist: [facility] previously. Establishing with ***  No chief complaint on file.   HPI:   Brian Kelly is a 53 y.o. male presenting today at the request of   He has history of diarrhea, colon polyps, biliary and pancreatic duct dilation, chronic hep C  EUS 04/22/2022: - There was no evidence of significant pathology in the visualized portion of the liver.  - There was no sign of significant pathology in the entire pancreas.  - There was dilation in the common bile duct which measured up to 11 mm.   - There was no sign of significant pathology in the ampulla.  - A few enlarged lymph nodes were visualized in the  peripancreatic region.  - No specimens collected.  - In summary, benign appearing dilation of the main  bile duct without evidence of mechanical obstruction. No signifciant pancreatic duct dilation.   Last colonoscopy 07/22/2016: - Preparation of the colon was fair.  - Two 1 to 2 mm polyps in the rectum, removed with a cold biopsy forceps. Resected and retrieved.  - One 5 mm polyp in the distal sigmoid colon, removed with a cold snare. Resected and retrieved.  - One 4 mm polyp in the proximal sigmoid colon, removed with a cold snare. Resected and retrieved.  - Two 3 to 5 mm polyps in the proximal descending colon, removed with a cold snare. Resected and retrieved. - Four 3 to 6 mm polyps in the distal descending colon, removed with a cold snare. Resected and retrieved.  - Two 1 to 2 mm polyps in the distal sigmoid colon, removed with a cold biopsy forceps. Resected and retrieved.  - Biopsies were taken with a cold forceps from the right colon and left colon for evaluation of microscopic colitis. - Path not available.   Reviewed last OV with [facility] in 2022.  Noted 20+ year history of diarrhea.  He was taking 12 Imodium per day at that point.  With that, he was  having 1-2 bowel movements per day.  Also with bloody mucus in his stool.  Reported no improvement with prior trial of Creon.  Fecal elastase could not be completed due to stool being too watery.  Discussed option of repeating colonoscopy, but ultimately a plan to try Levsin. Noted prior infectious studies had been negative.   Prior evaluation of diarrhea in Care Everywhere shows negative celiac panel in 2018. No other stool test available for review.    Today:  History of colon polyps:    History of chronic diarrhea:    TSH recently normal in July.   Chronic Hepatitis C:  08/14/23: Hep C RNA quant 9,500,000 Alk phos elevated at 175.  AST, ALT, bilirubin, creatinine, electrolytes, platelets within normal limits.  04/27/2020. Hep B surface antibody positive   Hep A antibody negative.  Past Medical History:  Diagnosis Date   Anxiety    Bipolar 1 disorder (HCC)    History of colon polyps    Mood disorder [facility])     Past Surgical History:  Procedure Laterality Date   COLONOSCOPY     COLONOSCOPY WITH PROPOFOL  N/A 07/22/2016   Procedure: COLONOSCOPY WITH PROPOFOL ;  Surgeon: Gaylyn Gladis PENNER, MD;  Location: [facility] ENDOSCOPY;  Service: Endoscopy;  Laterality: N/A;   ESOPHAGOGASTRODUODENOSCOPY      Current Outpatient Medications  Medication Sig Dispense Refill   atorvastatin (LIPITOR) 10 MG tablet Take 10 mg by  mouth daily.     carbamazepine (TEGRETOL) 200 MG tablet Take 200 mg by mouth as directed. (Patient not taking: Reported on 07/31/2021)     cyclobenzaprine (FLEXERIL) 5 MG tablet Take 5 mg by mouth 3 (three) times daily as needed for muscle spasms. (Patient not taking: Reported on 07/31/2021)     diazepam  (VALIUM ) 2 MG tablet Take 1 tablet (2 mg total) by mouth every 8 (eight) hours as needed for muscle spasms. (Patient not taking: Reported on 07/22/2016) 9 tablet 0   etodolac (LODINE) 500 MG tablet Take 500 mg by mouth 2 (two) times daily.     HYDROcodone -acetaminophen   (NORCO/VICODIN) 5-325 MG tablet Take 1 tablet by mouth every 4 (four) hours as needed for moderate pain. (Patient not taking: Reported on 07/22/2016) 20 tablet 0   lamoTRIgine (LAMICTAL) 100 MG tablet Take 100 mg by mouth daily.     lithium  carbonate 300 MG capsule Take 300 mg by mouth 2 (two) times daily with a meal.     metoprolol succinate (TOPROL-XL) 25 MG 24 hr tablet Take 25 mg by mouth daily.     naproxen  (NAPROSYN ) 500 MG tablet Take 1 tablet (500 mg total) by mouth 2 (two) times daily with a meal. (Patient not taking: Reported on 07/22/2016) 30 tablet 0   polyethylene glycol powder (GLYCOLAX/MIRALAX) powder Take 1 Container by mouth once.     QUEtiapine (SEROQUEL XR) 300 MG 24 hr tablet Take 300 mg by mouth at bedtime.     vitamin B-12 (CYANOCOBALAMIN) 1000 MCG tablet Take 1,000 mcg by mouth daily. (Patient not taking: Reported on 07/31/2021)     No current facility-administered medications for this visit.    Allergies as of 09/17/2023 - Review Complete 07/31/2021  Allergen Reaction Noted   Dilaudid [hydromorphone hcl]  01/17/2015    No family history on file.  Social History   Socioeconomic History   Marital status: Married    Spouse name: Not on file   Number of children: Not on file   Years of education: Not on file   Highest education level: Not on file  Occupational History   Not on file  Tobacco Use   Smoking status: Former    Current packs/day: 0.00    Types: Cigarettes    Quit date: 07/14/2015    Years since quitting: 8.1   Smokeless tobacco: Never  Substance and Sexual Activity   Alcohol use: No   Drug use: No   Sexual activity: Not on file  Other Topics Concern   Not on file  Social History Narrative   Not on file   Social Drivers of Health   Financial Resource Strain: Not on file  Food Insecurity: Not on file  Transportation Needs: Not on file  Physical Activity: Not on file  Stress: Not on file  Social Connections: Not on file  Intimate Partner  Violence: Not on file    Review of Systems: Gen: Denies any fever, chills, fatigue, weight loss, lack of appetite.  CV: Denies chest pain, heart palpitations, peripheral edema, syncope.  Resp: Denies shortness of breath at rest or with exertion. Denies wheezing or cough.  GI: Denies dysphagia or odynophagia. Denies jaundice, hematemesis, fecal incontinence. GU : Denies urinary burning, urinary frequency, urinary hesitancy MS: Denies joint pain, muscle weakness, cramps, or limitation of movement.  Derm: Denies rash, itching, dry skin Psych: Denies depression, anxiety, memory loss, and confusion Heme: Denies bruising, bleeding, and enlarged lymph nodes.  Physical Exam: There were no vitals  taken for this visit. General:   Alert and oriented. Pleasant and cooperative. Well-nourished and well-developed.  Head:  Normocephalic and atraumatic. Eyes:  Without icterus, sclera clear and conjunctiva pink.  Ears:  Normal auditory acuity. Lungs:  Clear to auscultation bilaterally. No wheezes, rales, or rhonchi. No distress.  Heart:  S1, S2 present without murmurs appreciated.  Abdomen:  +BS, soft, non-tender and non-distended. No HSM noted. No guarding or rebound. No masses appreciated.  Rectal:  Deferred  Msk:  Symmetrical without gross deformities. Normal posture. Extremities:  Without edema. Neurologic:  Alert and  oriented x4;  grossly normal neurologically. Skin:  Intact without significant lesions or rashes. Psych:  Alert and cooperative. Normal mood and affect.    Assessment:     Plan:  ***   Josette Centers, PA-C [facility] 09/17/2023

## 2023-09-17 ENCOUNTER — Telehealth: Payer: Self-pay | Admitting: *Deleted

## 2023-09-17 ENCOUNTER — Encounter: Payer: Self-pay | Admitting: Gastroenterology

## 2023-09-17 ENCOUNTER — Ambulatory Visit: Payer: Self-pay | Admitting: Gastroenterology

## 2023-09-17 VITALS — BP 101/65 | HR 71 | Temp 98.1°F | Ht 69.0 in | Wt 158.2 lb

## 2023-09-17 DIAGNOSIS — K625 Hemorrhage of anus and rectum: Secondary | ICD-10-CM | POA: Diagnosis not present

## 2023-09-17 DIAGNOSIS — Z860100 Personal history of colon polyps, unspecified: Secondary | ICD-10-CM | POA: Insufficient documentation

## 2023-09-17 DIAGNOSIS — B1920 Unspecified viral hepatitis C without hepatic coma: Secondary | ICD-10-CM | POA: Diagnosis not present

## 2023-09-17 DIAGNOSIS — R1032 Left lower quadrant pain: Secondary | ICD-10-CM | POA: Diagnosis not present

## 2023-09-17 DIAGNOSIS — K529 Noninfective gastroenteritis and colitis, unspecified: Secondary | ICD-10-CM | POA: Insufficient documentation

## 2023-09-17 DIAGNOSIS — R1904 Left lower quadrant abdominal swelling, mass and lump: Secondary | ICD-10-CM | POA: Insufficient documentation

## 2023-09-17 NOTE — Patient Instructions (Addendum)
 Please have blood work and stool test completed at [facility].  We are arranging for you to have a CT of your abdomen and pelvis to further evaluate pain in your left lower abdomen and the lump that was felt in the left lower abdomen on exam today.  We will also arrange to have a colonoscopy in the near future with Dr. Shaaron. You will need to hold Imodium 24 hours prior to starting your colon prep.  Further recommendations to follow your labs and imaging.   Josette Centers, PA-C [facility]

## 2023-09-17 NOTE — Telephone Encounter (Signed)
 PA approved via [employer] but would not approve [facility]. He will have to go to [facility] Authorization [id number] DOS: 09/17/2023-03/15/2024   Called pt and LM advising he will have to go to [place] [facility].

## 2023-09-17 NOTE — Telephone Encounter (Signed)
 LMOVM to call back to schedule TCS with Dr. Shaaron, ASA 2, stop imodium 24 hrs prior

## 2023-09-18 MED ORDER — PEG 3350-KCL-NA BICARB-NACL 420 G PO SOLR: 4000.0000 mL | Freq: Once | ORAL | 0 refills | Status: DC

## 2023-09-18 NOTE — Telephone Encounter (Signed)
 Spoke with pt. He has been scheduled for 8/27. Aware will mail instructions and send rx for prep to pharmacy

## 2023-09-18 NOTE — Addendum Note (Signed)
 Addended by: JEANELL GRAEME RAMAN on: 09/18/2023 08:24 AM   Modules accepted: Orders

## 2023-09-22 ENCOUNTER — Telehealth: Payer: Self-pay | Admitting: Gastroenterology

## 2023-09-22 NOTE — Telephone Encounter (Signed)
 Received colonoscopy pathology from colonoscopy completed in 2018.  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  Left and right colon biopsies were negative.  Repeat colonoscopy 05/18/2018 with 1 hyperplastic and 1 tubular adenoma removed.  Random colon biopsies again negative.  Recommended 5-year surveillance colonoscopy.  Procedures performed at [facility].  No additional recommendations at this time.

## 2023-09-26 ENCOUNTER — Inpatient Hospital Stay: Admission: RE | Admit: 2023-09-26 | Source: Ambulatory Visit

## 2023-10-06 ENCOUNTER — Encounter (HOSPITAL_COMMUNITY)
Admission: RE | Admit: 2023-10-06 | Discharge: 2023-10-06 | Disposition: A | Discharge status: Home / Self Care | Source: Ambulatory Visit | Attending: Internal Medicine | Admitting: Internal Medicine

## 2023-10-06 ENCOUNTER — Telehealth: Payer: Self-pay | Admitting: *Deleted

## 2023-10-06 NOTE — Telephone Encounter (Signed)
 LMOVM to return call  Pt left vm saying he needed to reschedule his procedure on 10/08/23

## 2023-10-06 NOTE — Telephone Encounter (Signed)
 Pt is cancelling his procedure on 10/08/23 due to being sick. He wants to be rescheduled in October. Will call pt once we get providers schedule.

## 2023-10-08 ENCOUNTER — Encounter (HOSPITAL_COMMUNITY): Payer: Self-pay

## 2023-10-08 ENCOUNTER — Ambulatory Visit (HOSPITAL_COMMUNITY): Admit: 2023-10-08 | Admitting: Internal Medicine

## 2023-10-08 SURGERY — COLONOSCOPY
Anesthesia: Choice

## 2023-10-16 ENCOUNTER — Other Ambulatory Visit

## 2023-10-17 ENCOUNTER — Ambulatory Visit
Admission: RE | Admit: 2023-10-17 | Discharge: 2023-10-17 | Disposition: A | Discharge status: Home / Self Care | Source: Ambulatory Visit | Attending: Gastroenterology | Admitting: Gastroenterology

## 2023-10-17 ENCOUNTER — Ambulatory Visit: Payer: Self-pay | Admitting: Gastroenterology

## 2023-10-17 MED ORDER — IOPAMIDOL (ISOVUE-300) INJECTION 61%
100.0000 mL | Freq: Once | INTRAVENOUS | Status: AC | PRN
  Administered 2023-10-17: 100 mL via INTRAVENOUS

## 2023-10-21 ENCOUNTER — Other Ambulatory Visit: Payer: Self-pay | Admitting: *Deleted

## 2023-10-21 DIAGNOSIS — R932 Abnormal findings on diagnostic imaging of liver and biliary tract: Secondary | ICD-10-CM

## 2023-10-21 NOTE — Addendum Note (Signed)
 Addended by: GAYLENE MADELIN CROME on: 10/21/2023 01:32 PM   Modules accepted: Orders

## 2023-10-22 NOTE — Telephone Encounter (Signed)
 Pt has been rescheduled for 11/27/23, updated instructions mailed. Prep resent to pharmacy.

## 2023-10-22 NOTE — Telephone Encounter (Signed)
 LMOVM to return call   TCS w/Dr.Rourk, asa 2

## 2023-10-23 ENCOUNTER — Encounter: Payer: Self-pay | Admitting: *Deleted

## 2023-10-23 ENCOUNTER — Other Ambulatory Visit: Payer: Self-pay | Admitting: *Deleted

## 2023-10-23 MED ORDER — PEG 3350-KCL-NA BICARB-NACL 420 G PO SOLR: 4000.0000 mL | Freq: Once | ORAL | 0 refills | Status: AC

## 2023-10-27 NOTE — Progress Notes (Signed)
 noted

## 2023-11-18 ENCOUNTER — Other Ambulatory Visit

## 2023-11-26 ENCOUNTER — Telehealth: Payer: Self-pay | Admitting: *Deleted
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
## 2023-11-27 ENCOUNTER — Encounter (HOSPITAL_COMMUNITY): Admission: RE | Payer: Self-pay | Source: Home / Self Care

## 2023-11-27 ENCOUNTER — Ambulatory Visit (HOSPITAL_COMMUNITY): Admission: RE | Admit: 2023-11-27 | Source: Home / Self Care | Admitting: Internal Medicine

## 2023-11-27 SURGERY — COLONOSCOPY
Anesthesia: Choice

## 2023-12-03 ENCOUNTER — Encounter: Payer: Self-pay | Admitting: *Deleted

## 2023-12-03 NOTE — Telephone Encounter (Signed)
LMOVM to return call. Letter mailed
